# Patient Record
Sex: Female | Born: 1990 | Race: White | Hispanic: No | Marital: Single | Smoking: Never smoker
Health system: Southern US, Community
[De-identification: ages and names within clinical notes are randomized; demographics above are authoritative.]

## PROBLEM LIST (undated history)

## (undated) HISTORY — PX: BARIATRIC SURGERY: SHX1103

---

## 2013-08-03 ENCOUNTER — Emergency Department: Payer: Self-pay | Admitting: Emergency Medicine

## 2015-09-27 IMAGING — CR DG ELBOW COMPLETE 3+V*L*
1 series · 4 of 4 positions shown · non-contrast
Comparison: None.

CLINICAL DATA: Left elbow bruising and pain after a fall.

EXAM:
LEFT ELBOW - COMPLETE 3+ VIEW

[Series 1: lat · 0.17mm/px · 4 of 4 slices shown]
[im 1/4]
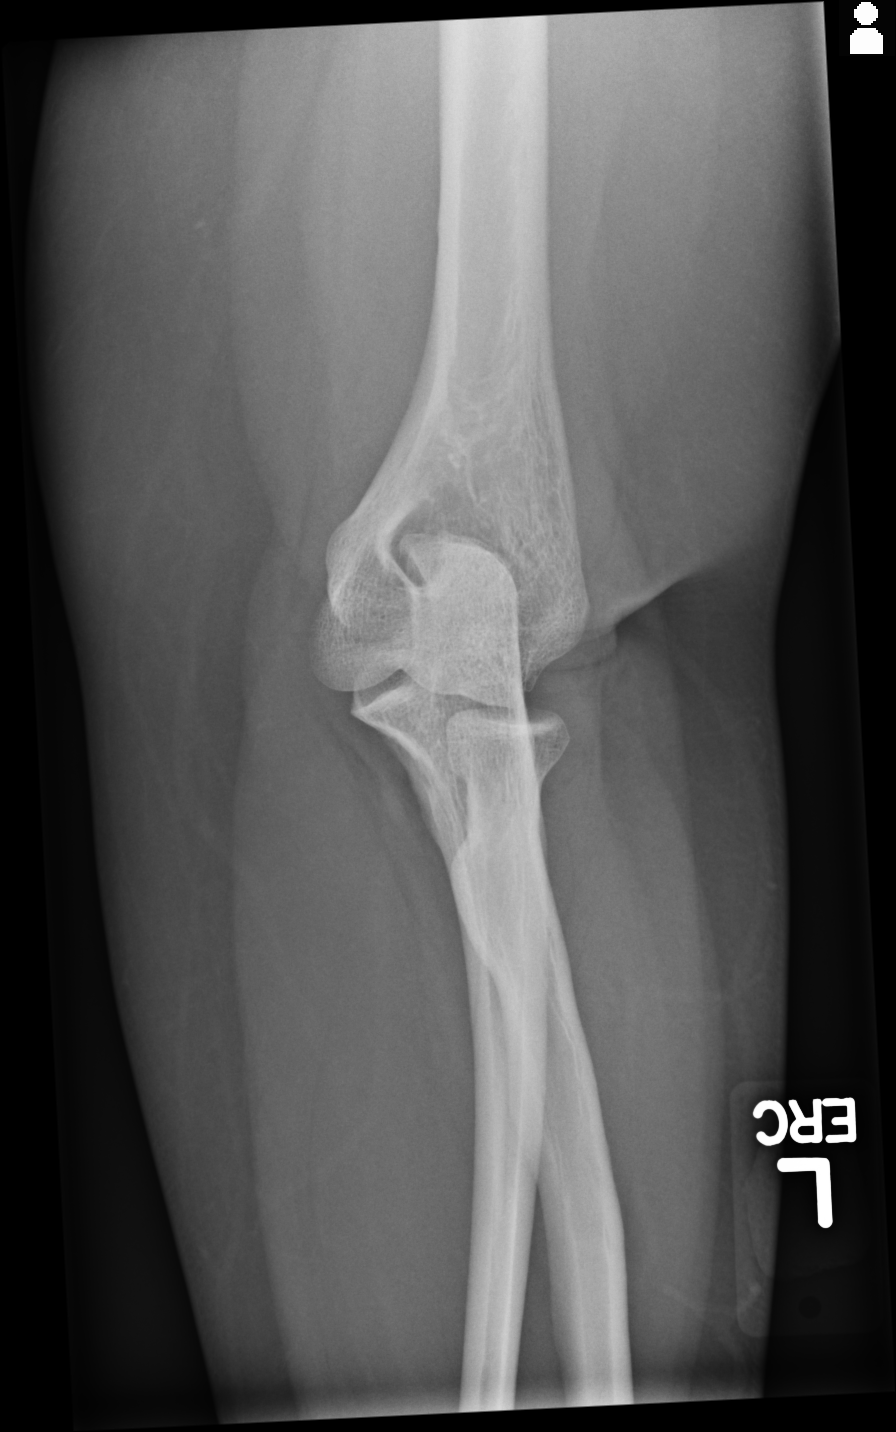
[im 2/4]
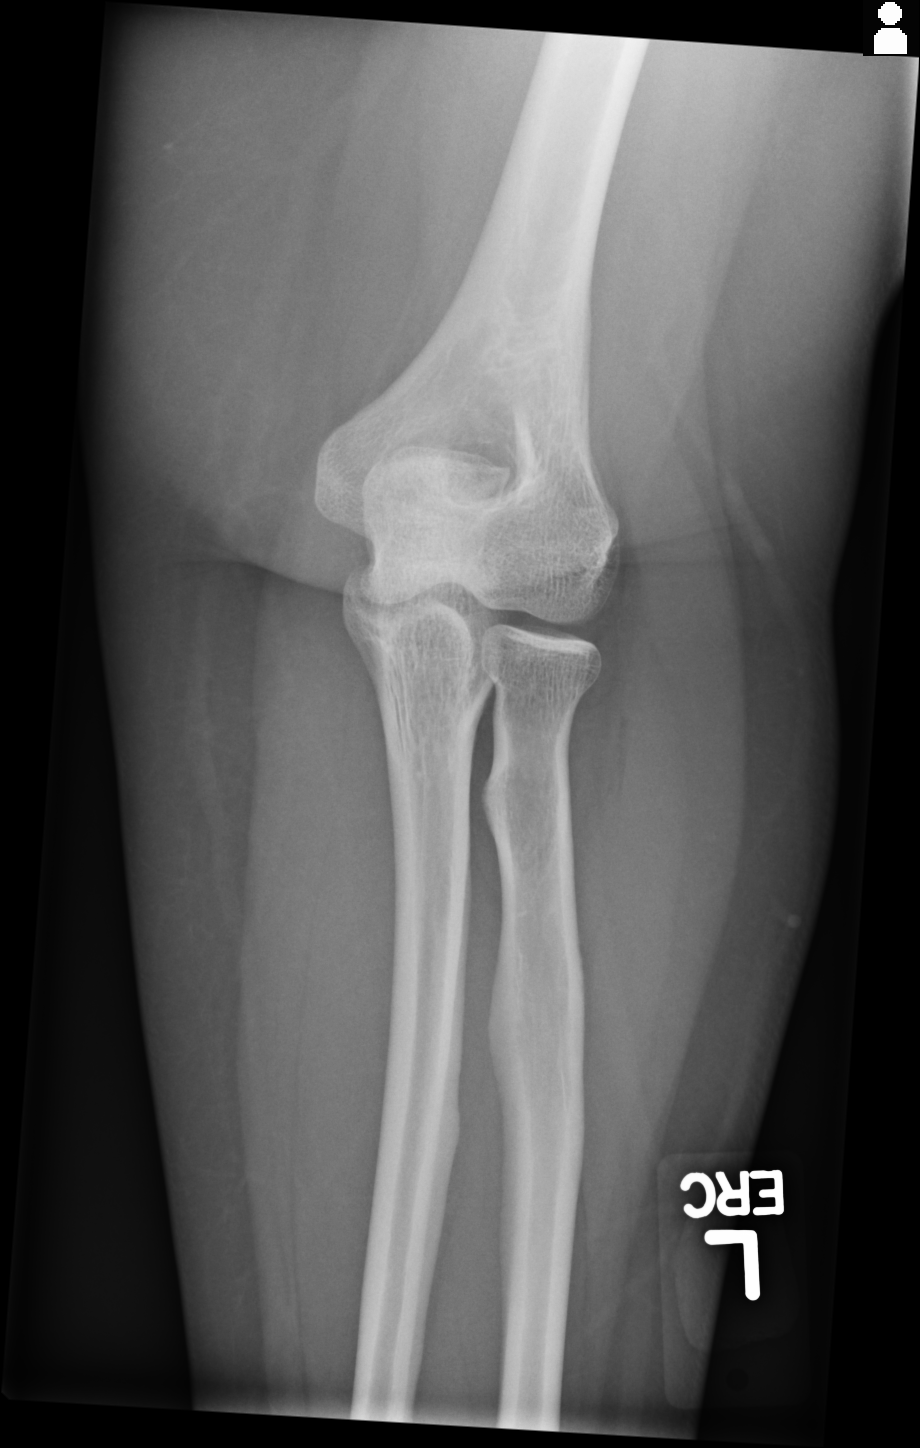
[im 3/4]
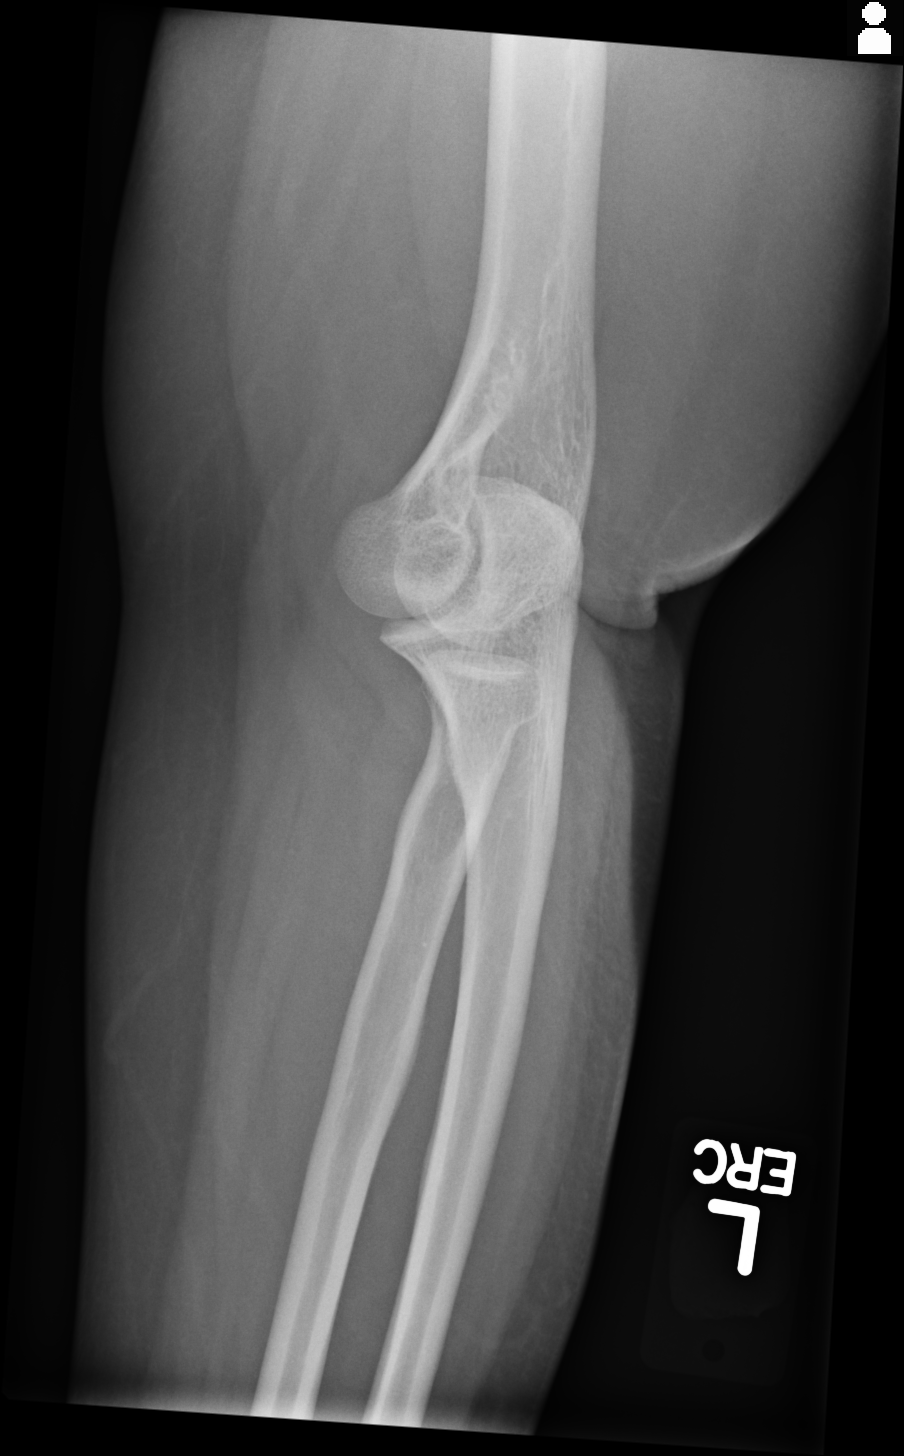
[im 4/4]
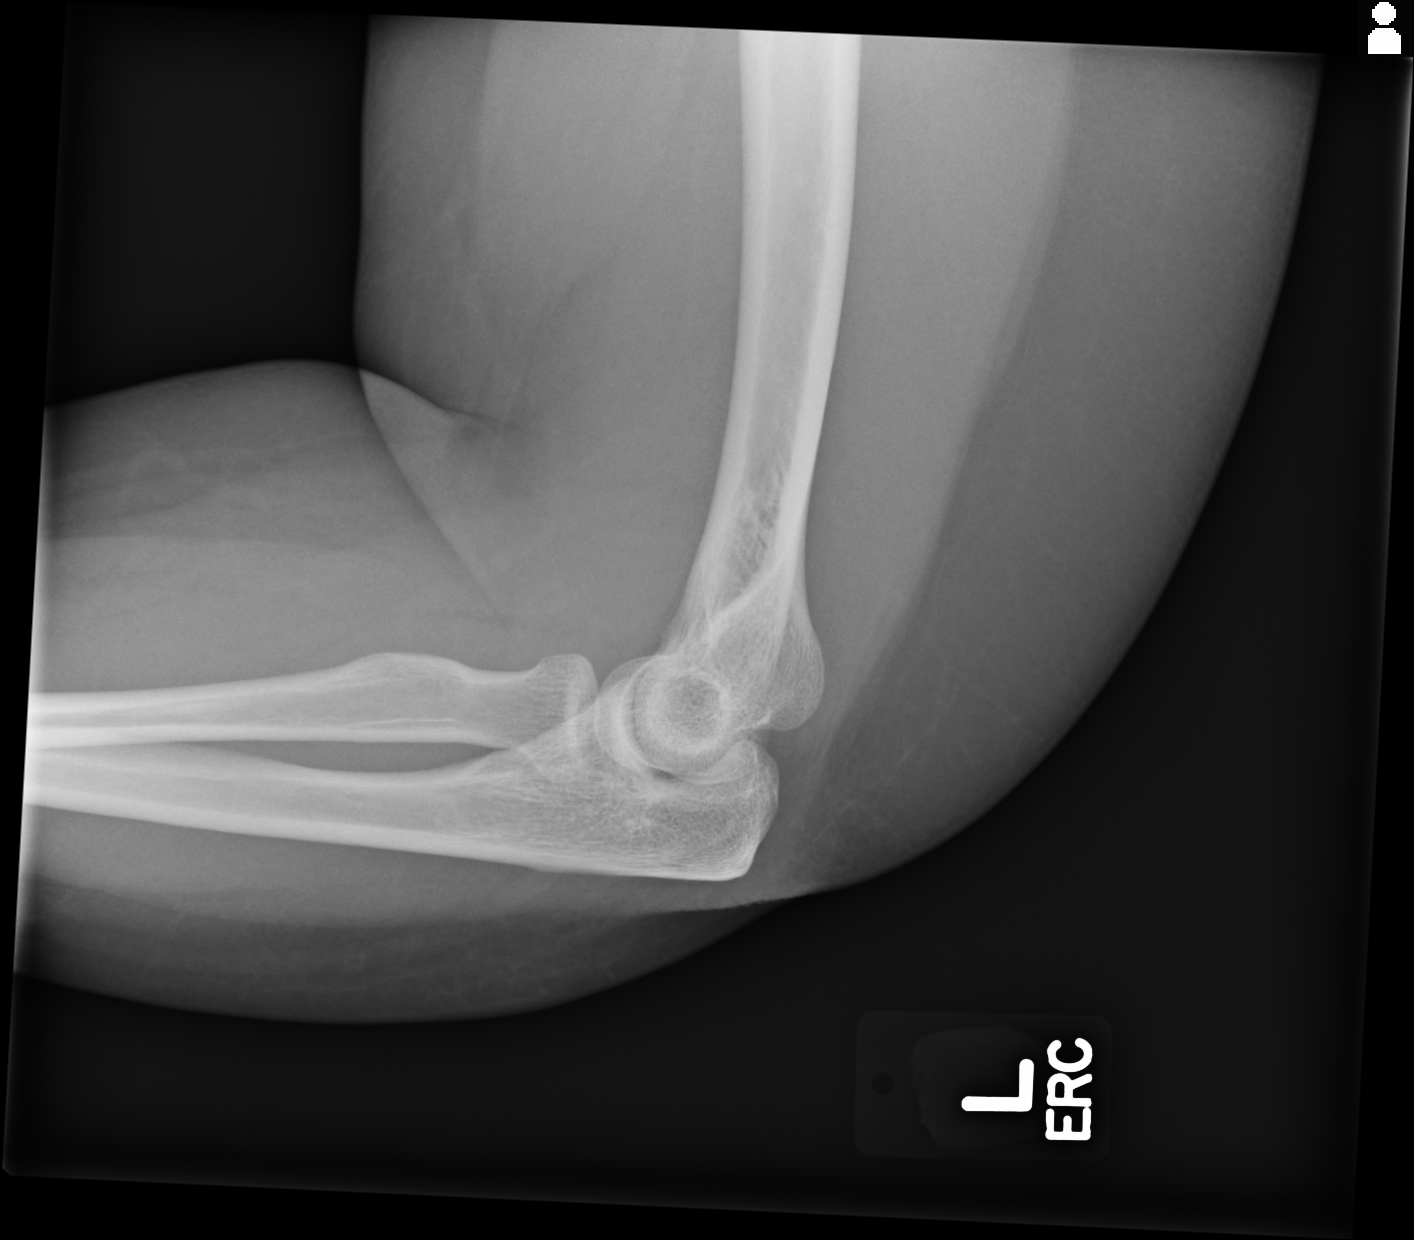

[4 of 4 positions shown; findings below may reference images not displayed]

FINDINGS: Imaged bones, joints and soft tissues appear normal.
IMPRESSION: Negative exam.

## 2016-06-06 ENCOUNTER — Emergency Department: Payer: Managed Care, Other (non HMO)

## 2016-06-06 ENCOUNTER — Encounter: Payer: Self-pay | Admitting: Emergency Medicine

## 2016-06-06 ENCOUNTER — Emergency Department
Admission: EM | Admit: 2016-06-06 | Discharge: 2016-06-06 | Disposition: A | Discharge status: Home / Self Care | Payer: Managed Care, Other (non HMO) | Attending: Emergency Medicine | Admitting: Emergency Medicine

## 2016-06-06 DIAGNOSIS — N9489 Other specified conditions associated with female genital organs and menstrual cycle: Secondary | ICD-10-CM | POA: Diagnosis not present

## 2016-06-06 DIAGNOSIS — R55 Syncope and collapse: Secondary | ICD-10-CM | POA: Insufficient documentation

## 2016-06-06 DIAGNOSIS — R110 Nausea: Secondary | ICD-10-CM | POA: Insufficient documentation

## 2016-06-06 LAB — BASIC METABOLIC PANEL
ANION GAP: 6 (ref 5–15)
BUN: 7 mg/dL (ref 6–20)
CALCIUM: 8.4 mg/dL — AB (ref 8.9–10.3)
CHLORIDE: 107 mmol/L (ref 101–111)
CO2: 25 mmol/L (ref 22–32)
Creatinine, Ser: 0.51 mg/dL (ref 0.44–1.00)
GFR calc non Af Amer: >60 mL/min (ref >60)
Glucose, Bld: 87 mg/dL (ref 65–99)
Potassium: 3.4 mmol/L — ABNORMAL LOW (ref 3.5–5.1)
Sodium: 138 mmol/L (ref 135–145)

## 2016-06-06 LAB — CBC
HCT: 39.4 % (ref 35.0–47.0)
HEMOGLOBIN: 13.1 g/dL (ref 12.0–16.0)
MCH: 27.9 pg (ref 26.0–34.0)
MCHC: 33.3 g/dL (ref 32.0–36.0)
MCV: 83.7 fL (ref 80.0–100.0)
Platelets: 260 10*3/uL (ref 150–440)
RBC: 4.71 MIL/uL (ref 3.80–5.20)
RDW: 14.3 % (ref 11.5–14.5)
WBC: 5.5 10*3/uL (ref 3.6–11.0)

## 2016-06-06 LAB — HCG, QUANTITATIVE, PREGNANCY

## 2016-06-06 MED ORDER — SODIUM CHLORIDE 0.9 % IV BOLUS (SEPSIS)
1000.0000 mL | Freq: Once | INTRAVENOUS | Status: AC
  Administered 2016-06-06: 1000 mL via INTRAVENOUS

## 2016-06-06 NOTE — Discharge Instructions (Signed)
Please make sure you remain well-hydrated and follow-up with your primary care physician within 2 days for recheck. Return to the emergency department for any new or worsening symptoms such as palpitations, chest pain, or for any other concerns.

## 2016-06-06 NOTE — ED Notes (Signed)
Pt returned from xray via stretcher.

## 2016-06-06 NOTE — ED Triage Notes (Signed)
States since Sunday morning has had 5 episodes of near syncope.  Patient states she has been "sick" with cough and sinus congestion.

## 2016-06-06 NOTE — ED Notes (Addendum)
Pt states past 2 weeks has had episodes of blacking out but not fully passing out. States she feels hot then sees black and catches herself on chairs or something. Denies actually passing out all the way. States happened around 6:30 and since hands have been tingling.   Pt states she has been drinking water a lot but not eating well. 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 States normally snacking all the time but food has been making her nauseous recently pt states throwing up food several times, also states cough. Pt is alert and oriented, visitor at bedside.

## 2016-06-06 NOTE — ED Provider Notes (Signed)
[facility] Emergency Department Provider Note  ____________________________________________   First MD Initiated Contact with Patient 06/06/16 2024     (approximate) 00 I have reviewed the triage vital signs and the nursing notes.   HISTORY  Chief Complaint Near Syncope    HPI Julia Fuller is a 26 y.o. female who self presents to the emergency department with 5 near syncopal episodes in the last 48 hours. The last 2 weeks or so she's had an influenza-like illness. 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 The past 48 hours she has reported multiple episodes where she feels nauseated and hot. Ears begin ringing and her vision closes and she feels like she might pass out. She has never actually passed out. She reports no chest pain or palpitations. She has no family history of sudden cardiac death.   History reviewed. No pertinent past medical history.  There are no active problems to display for this patient.   Past Surgical History:  Procedure Laterality Date  . BARIATRIC SURGERY     2016    Prior to Admission medications   Not on File    Allergies Hydrocodone  No family history on file.  Social History Social History  Substance Use Topics  . Smoking status: Never Smoker  . Smokeless tobacco: Never Used  . Alcohol use No    Review of Systems Constitutional: No fever/chills Eyes: No visual changes. ENT: Positive rhinorrhea positive sore throat. Cardiovascular: Denies chest pain. Respiratory: Denies shortness of breath. Positive cough Gastrointestinal: No abdominal pain.  No nausea, no vomiting.  No diarrhea.  No constipation. Genitourinary: Negative for dysuria. Musculoskeletal: Negative for back pain. Skin: Negative for rash. Neurological: Negative for headaches, focal weakness or numbness.  10-point ROS otherwise  negative.  ____________________________________________   PHYSICAL EXAM:  VITAL SIGNS: ED Triage Vitals  Enc Vitals Group     BP 06/06/16 1914 (!) 148/93     Pulse Rate 06/06/16 1914 80     Resp 06/06/16 1914 18     Temp 06/06/16 1914 98.5 F (36.9 C)     Temp Source 06/06/16 1914 Oral     SpO2 06/06/16 1914 100 %     Weight 06/06/16 1914 152 lb (68.9 kg)     Height 06/06/16 1914 5\' 1"  (1.549 m)     Head Circumference --      Peak Flow --      Pain Score 06/06/16 1917 0     Pain Loc --      Pain Edu? --      Excl. in GC? --     Constitutional: Alert and oriented x 4 well appearing nontoxic no diaphoresis speaks in full, clear sentences Eyes: PERRL EOMI. Head: Atraumatic. Nose: Positive minimal rhinorrhea Mouth/Throat: No trismus Neck: No stridor.   Cardiovascular: Normal rate, regular rhythm. Grossly normal heart sounds.  Good peripheral circulation. Respiratory: Normal respiratory effort.  No retractions. Lungs CTAB and moving good air Gastrointestinal: Soft nondistended nontender no rebound no guarding no peritonitis no McBurney's tenderness negative Rovsing's no costovertebral tenderness negative Murphy's Musculoskeletal: No lower extremity edema   Neurologic:  Normal speech and language. No gross focal neurologic deficits are appreciated. Skin:  Skin is warm, dry and intact. No rash noted. Psychiatric: Mood and affect are normal. Speech and behavior are normal.  ____________________________________________   LABS (all labs ordered are listed, but  only abnormal results are displayed)  Labs Reviewed  BASIC METABOLIC PANEL - Abnormal; Notable for the following:       Result Value   Potassium 3.4 (*)    Calcium 8.4 (*)    All other components within normal limits  CBC  HCG, QUANTITATIVE, PREGNANCY  URINALYSIS, COMPLETE (UACMP) WITH MICROSCOPIC  CBG MONITORING, ED   ____________________________________________  EKG  ED ECG REPORT I, Merrily BrittleNeil Anaeli Cornwall, the  attending physician, personally viewed and interpreted this ECG.  Date: 06/06/2016 EKG Time: 1921 Rate: 79 Rhythm: normal sinus rhythm QRS Axis: normal Intervals: normal ST/T Wave abnormalities: normal Conduction Disturbances: none Narrative Interpretation: unremarkable no Brugada normal QTC no blocks no arrhythmia no arrhythmogenic right ventricular dysplasia no signs of hypertrophic cardiomyopathy normal EKG  ____________________________________________  RADIOLOGY  000____________________________________________   PROCEDURES  Procedure(s) performed: no  Procedures  Critical Care performed: no  ____________________________________________   INITIAL IMPRESSION / ASSESSMENT AND PLAN / ED COURSE  Pertinent labs & imaging results that were available during my care of the patient were reviewed by me and considered in my medical decision making (see chart for details).  The patient is very well-appearing and hemodynamically stable. Her labs are unremarkable and she is not pregnant. Her EKG has no danger signs for syncope. She has multiple episodes of vasovagal near syncope likely related to dehydration from her viral illness.      ____________________________________________   FINAL CLINICAL IMPRESSION(S) / ED DIAGNOSES  Final diagnoses:  Near syncope  Vasovagal episode      NEW MEDICATIONS STARTED DURING THIS VISIT:  There are no discharge medications for this patient.    Note:  This document was prepared using Dragon voice recognition software and may include unintentional dictation errors.     Merrily BrittleNeil Ranson Belluomini, MD 06/07/16 [id number]

## 2018-07-31 IMAGING — CR DG CHEST 2V
1 series · 2 of 2 positions shown · non-contrast
Comparison: None.

CLINICAL DATA: Syncope.  Cough and congestion.

EXAM:
CHEST  2 VIEW

[Series 1: dg chest 2 view · 0.14mm/px · 2 of 2 slices shown]
[im 1/2]
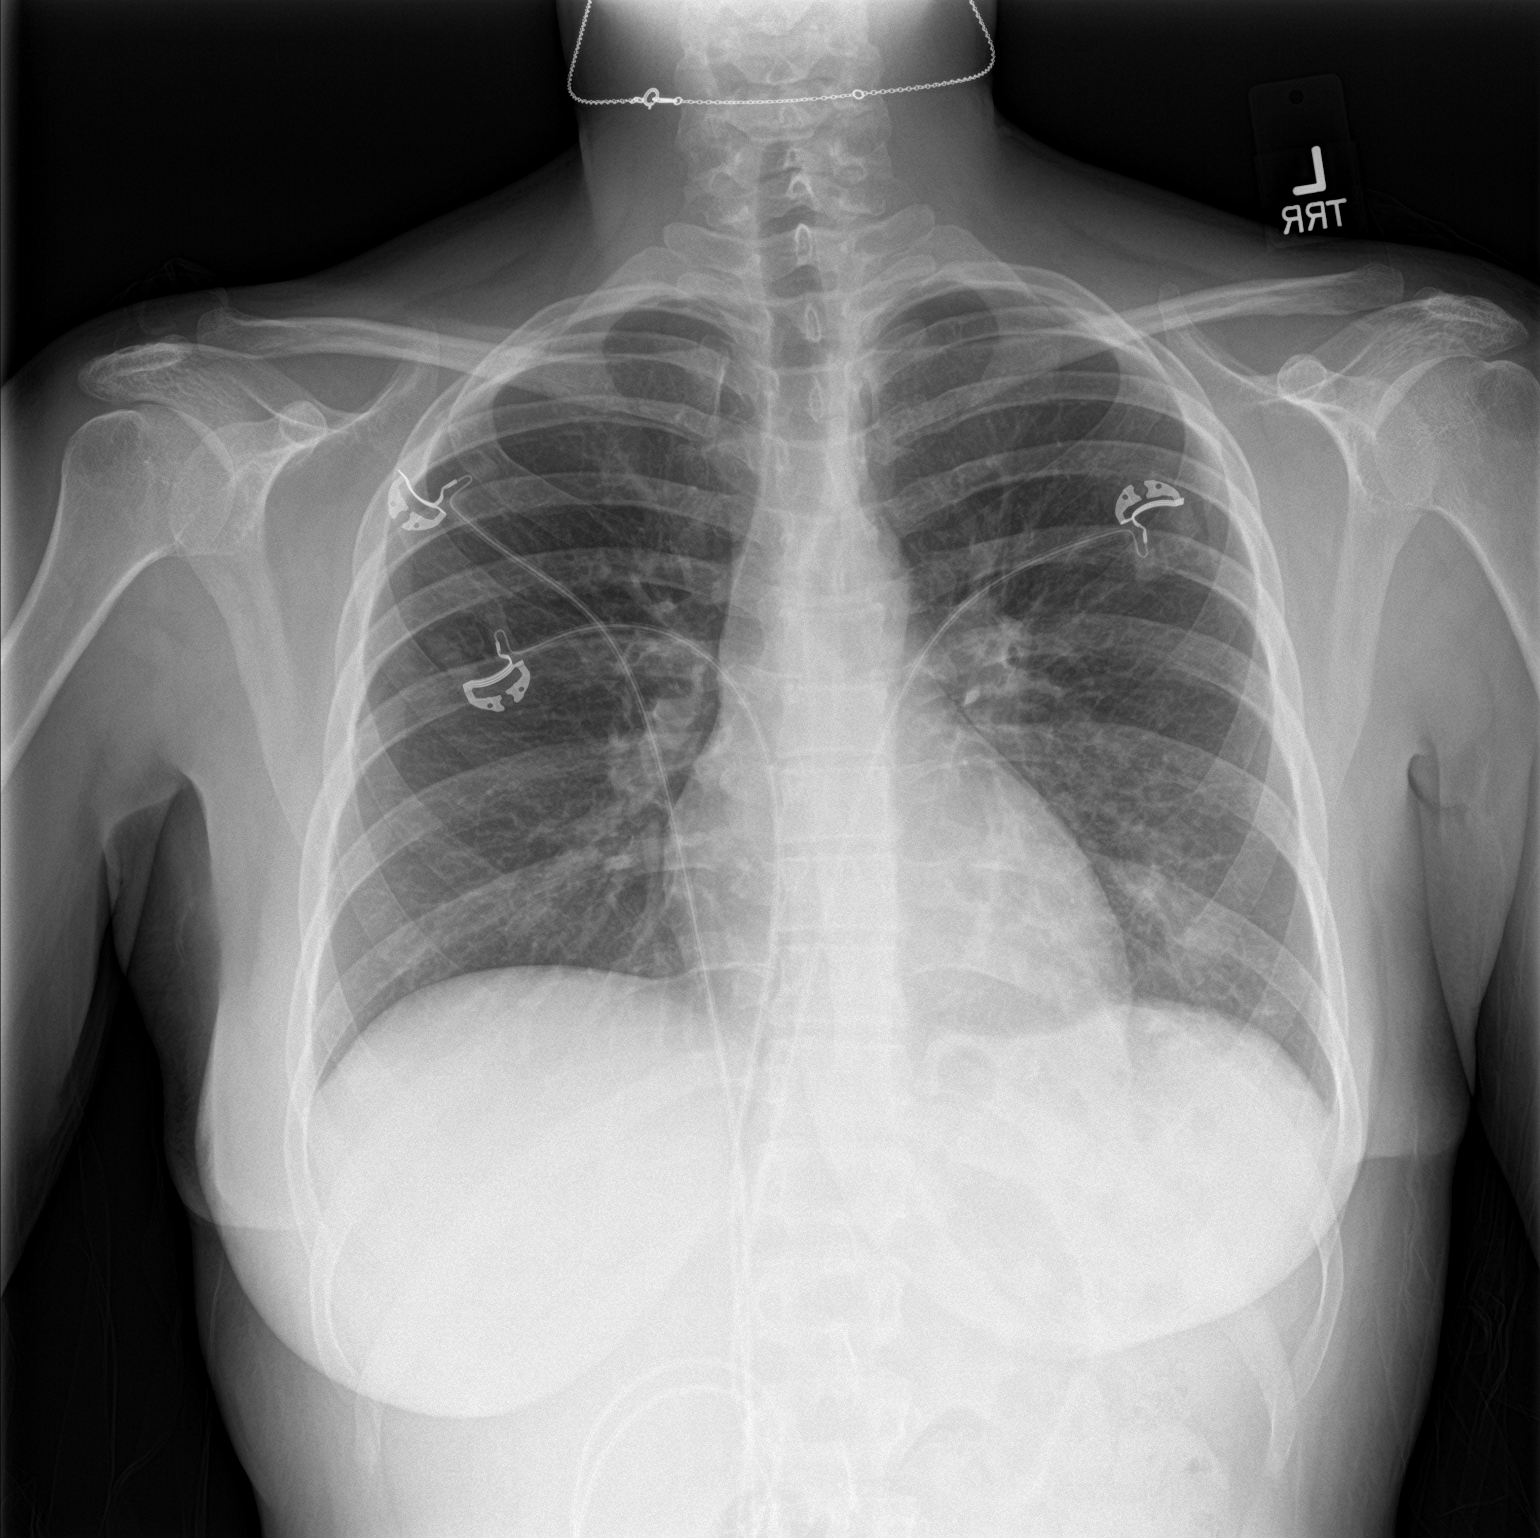
[im 2/2]
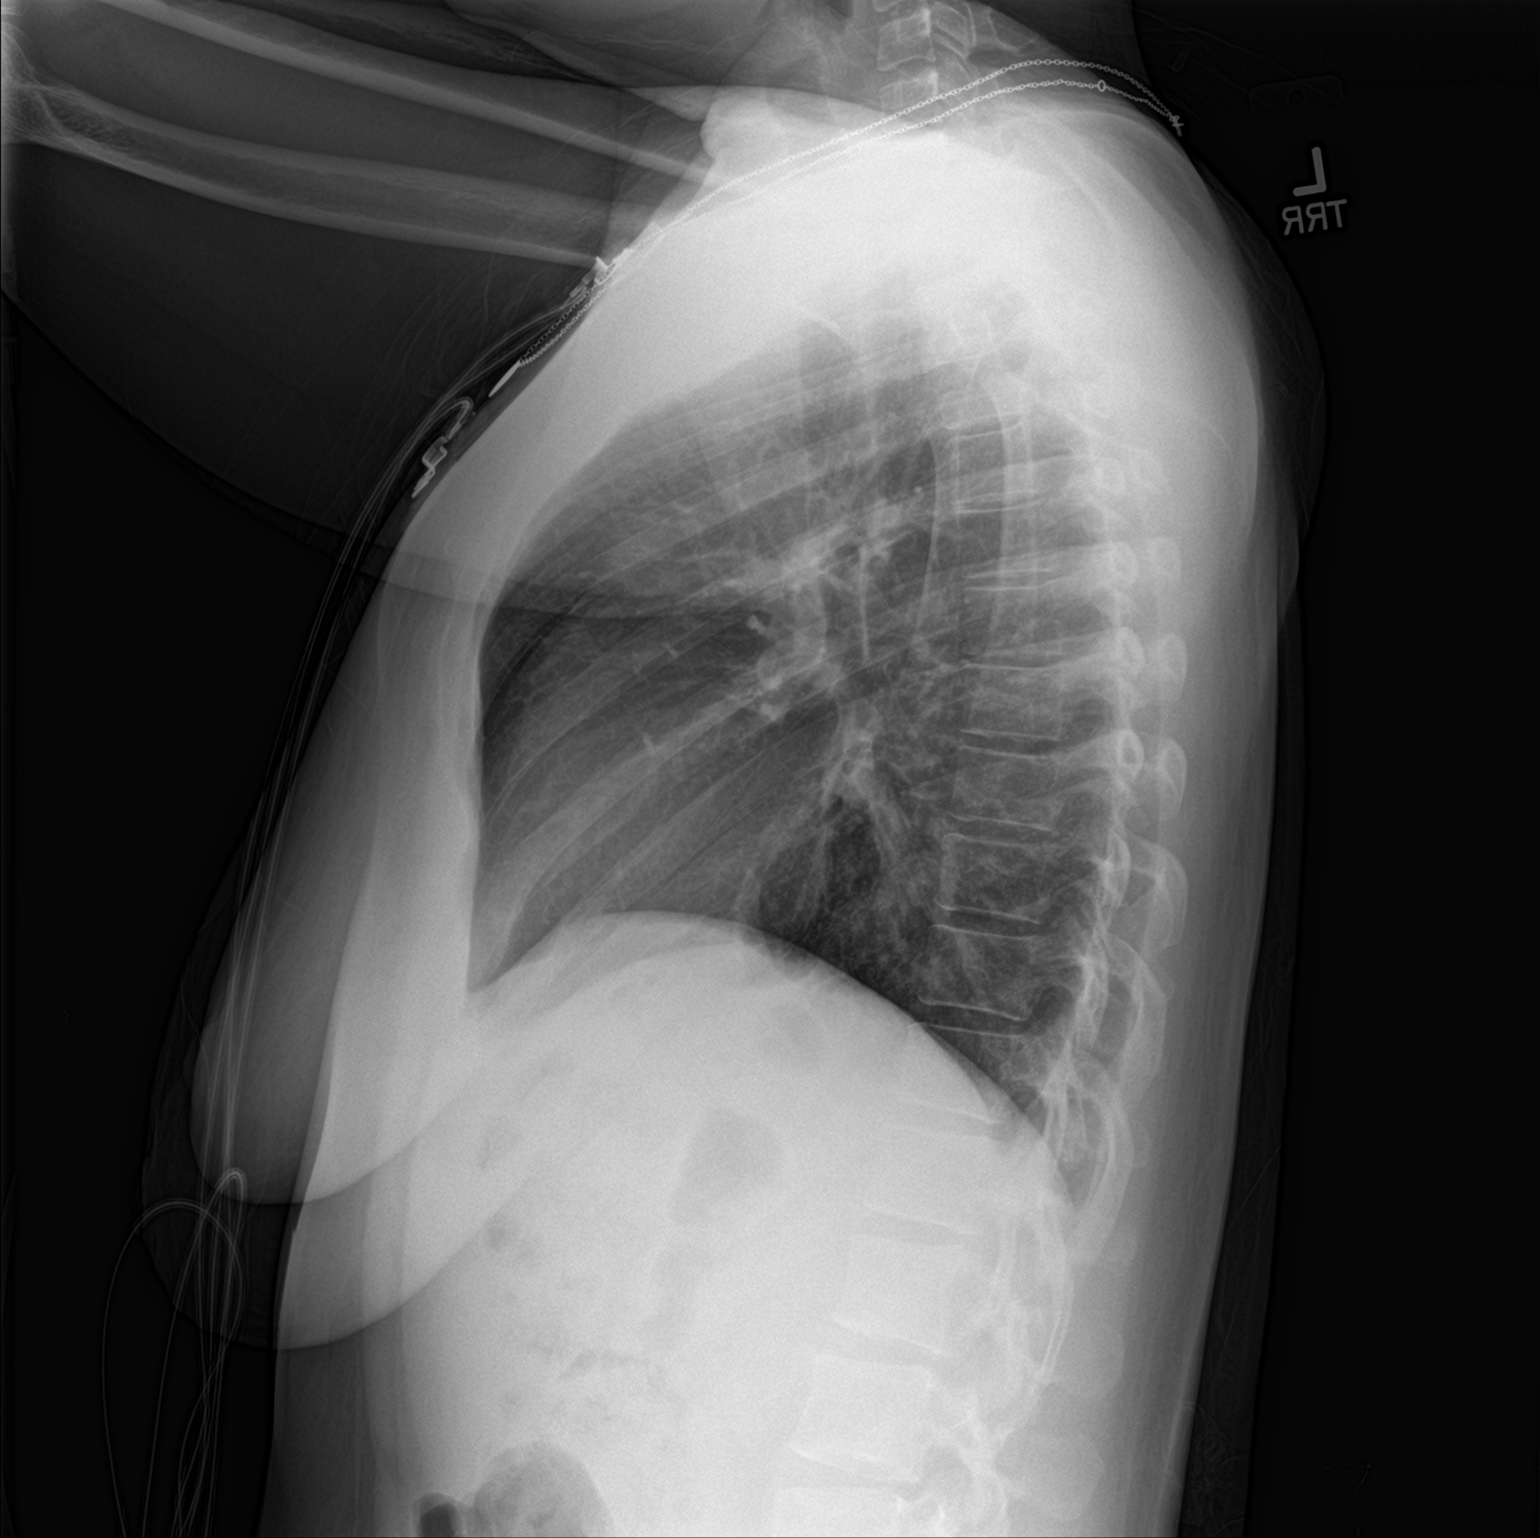

[2 of 2 positions shown; findings below may reference images not displayed]

FINDINGS: The cardiomediastinal contours are normal. The lungs are clear.
Pulmonary vasculature is normal. No consolidation, pleural effusion,
or pneumothorax. No acute osseous abnormalities are seen.
IMPRESSION: No acute pulmonary process.
